# Patient Record
Sex: Female | Born: 2005 | Race: White | Hispanic: Yes | Marital: Single | Smoking: Never smoker
Health system: Southern US, Community
[De-identification: ages and names within clinical notes are randomized; demographics above are authoritative.]

## PROBLEM LIST (undated history)

## (undated) HISTORY — PX: TONSILLECTOMY: SUR1361

---
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
## 2024-01-13 ENCOUNTER — Ambulatory Visit (HOSPITAL_COMMUNITY)
Admission: EM | Admit: 2024-01-13 | Discharge: 2024-01-13 | Disposition: A | Discharge status: Home / Self Care | Attending: Physician Assistant | Admitting: Physician Assistant

## 2024-01-13 ENCOUNTER — Encounter (HOSPITAL_COMMUNITY): Payer: Self-pay | Admitting: Emergency Medicine

## 2024-01-13 DIAGNOSIS — J019 Acute sinusitis, unspecified: Secondary | ICD-10-CM | POA: Diagnosis not present

## 2024-01-13 LAB — POC COVID19/FLU A&B COMBO
Covid Antigen, POC: NEGATIVE
Influenza A Antigen, POC: NEGATIVE
Influenza B Antigen, POC: NEGATIVE

## 2024-01-13 MED ORDER — ACETAMINOPHEN 325 MG PO TABS
ORAL_TABLET | ORAL | Status: AC
  Filled 2024-01-13: qty 2

## 2024-01-13 MED ORDER — ACETAMINOPHEN 325 MG PO TABS
650.0000 mg | ORAL_TABLET | Freq: Once | ORAL | Status: AC
  Administered 2024-01-13: 650 mg via ORAL

## 2024-01-13 MED ORDER — AZITHROMYCIN 250 MG PO TABS: 250.0000 mg | ORAL_TABLET | Freq: Every day | ORAL | 0 refills | Status: AC

## 2024-01-13 NOTE — Discharge Instructions (Signed)
 Take antibiotic as prescribed. Recommend Flonase  and Mucinex for congestion. Make sure you are drinking lots of fluids. Can take Tylenol  or Profen as needed for fever and bodyaches.

## 2024-01-13 NOTE — ED Triage Notes (Signed)
 Pt reports congestion, headaches, sore throat, pressure in ears and jaw for 4 days.

## 2024-01-13 NOTE — ED Provider Notes (Signed)
 [facility]    CSN: [id number] Arrival date & time: 01/13/24  1801      History   Chief Complaint Chief Complaint  Patient presents with   Headache   Nasal Congestion    HPI Ashley Dorsey is a 18 y.o. female.   Patient complains of cough, congestion, headache, sore throat, ear pressure that started about 7 days ago.  She reports initially started with just a scratchy throat and then symptoms progressively became worse.  She reports body aches and chills at home.  Denies sick contacts.  She has only been taking Tylenol  ibuprofen  as needed with minimal relief.  Denies shortness of breath or wheezing.    History reviewed. No pertinent past medical history.  There are no active problems to display for this patient.   Past Surgical History:  Procedure Laterality Date   TONSILLECTOMY      OB History   No obstetric history on file.      Home Medications    Prior to Admission medications   Medication Sig Start Date End Date Taking? Authorizing Provider  azithromycin  (ZITHROMAX  Z-PAK) 250 MG tablet Take 1 tablet (250 mg total) by mouth daily for 5 doses. Take 2 tablets by mouth on the first day and then 1 by mouth on the remaining days. 01/13/24 01/18/24 Yes Ward, Harlene PEDLAR, PA-C  albuterol  (VENTOLIN  HFA) 108 (90 Base) MCG/ACT inhaler Inhale 1-2 puffs into the lungs every 6 (six) hours as needed for wheezing or shortness of breath. 05/02/21   Tonette Lauraine HERO, PA-C  benzonatate  (TESSALON ) 100 MG capsule Take 1 capsule (100 mg total) by mouth every 8 (eight) hours. 05/02/21   Tonette Lauraine HERO, PA-C  chlorhexidine  (PERIDEX ) 0.12 % solution Use as directed 15 mLs in the mouth or throat 2 (two) times daily. Patient not taking: Reported on 05/02/2021 06/05/20   Mario Million, MD  fluticasone  (FLONASE ) 50 MCG/ACT nasal spray Place 2 sprays into both nostrils daily. 05/02/21   Tonette Lauraine HERO, PA-C    Family History Family History  Problem Relation Age of  Onset   Healthy Mother    Healthy Father     Social History Social History   Tobacco Use   Smoking status: Never   Smokeless tobacco: Never  Vaping Use   Vaping status: Never Used  Substance Use Topics   Alcohol use: Never   Drug use: Never     Allergies   Patient has no known allergies.   Review of Systems Review of Systems  Constitutional:  Positive for fever. Negative for chills.  HENT:  Positive for congestion. Negative for ear pain and sore throat.   Eyes:  Negative for pain and visual disturbance.  Respiratory:  Positive for cough. Negative for shortness of breath.   Cardiovascular:  Negative for chest pain and palpitations.  Gastrointestinal:  Negative for abdominal pain and vomiting.  Genitourinary:  Negative for dysuria and hematuria.  Musculoskeletal:  Negative for arthralgias and back pain.  Skin:  Negative for color change and rash.  Neurological:  Positive for headaches. Negative for seizures and syncope.  All other systems reviewed and are negative.    Physical Exam Triage Vital Signs ED Triage Vitals  Encounter Vitals Group     BP 01/13/24 1855 115/74     Girls Systolic BP Percentile --      Girls Diastolic BP Percentile --      Boys Systolic BP Percentile --      Boys Diastolic BP Percentile --  Pulse Rate 01/13/24 1855 (!) 102     Resp 01/13/24 1855 16     Temp 01/13/24 1855 (!) 100.8 F (38.2 C)     Temp Source 01/13/24 1855 Oral     SpO2 01/13/24 1855 97 %     Weight --      Height --      Head Circumference --      Peak Flow --      Pain Score 01/13/24 1854 7     Pain Loc --      Pain Education --      Exclude from Growth Chart --    No data found.  Updated Vital Signs BP 115/74 (BP Location: Right Arm)   Pulse (!) 102   Temp (!) 100.8 F (38.2 C) (Oral)   Resp 16   LMP 12/16/2023 (Approximate)   SpO2 97%   Visual Acuity Right Eye Distance:   Left Eye Distance:   Bilateral Distance:    Right Eye Near:   Left Eye  Near:    Bilateral Near:     Physical Exam Vitals and nursing note reviewed.  Constitutional:      General: She is not in acute distress.    Appearance: She is well-developed.  HENT:     Head: Normocephalic and atraumatic.  Eyes:     Conjunctiva/sclera: Conjunctivae normal.  Cardiovascular:     Rate and Rhythm: Normal rate and regular rhythm.     Heart sounds: No murmur heard. Pulmonary:     Effort: Pulmonary effort is normal. No respiratory distress.     Breath sounds: Normal breath sounds.  Abdominal:     Palpations: Abdomen is soft.     Tenderness: There is no abdominal tenderness.  Musculoskeletal:        General: No swelling.     Cervical back: Neck supple.  Skin:    General: Skin is warm and dry.     Capillary Refill: Capillary refill takes less than 2 seconds.  Neurological:     Mental Status: She is alert.  Psychiatric:        Mood and Affect: Mood normal.      UC Treatments / Results  Labs (all labs ordered are listed, but only abnormal results are displayed) Labs Reviewed  POC COVID19/FLU A&B COMBO    EKG   Radiology No results found.  Procedures Procedures (including critical care time)  Medications Ordered in UC Medications  acetaminophen  (TYLENOL ) tablet 650 mg (650 mg Oral Given 01/13/24 1859)    Initial Impression / Assessment and Plan / UC Course  I have reviewed the triage vital signs and the nursing notes.  Pertinent labs & imaging results that were available during my care of the patient were reviewed by me and considered in my medical decision making (see chart for details).     Will treat for acute sinusitis with  azithromycin .  Supportive care discussed.  Lungs clear to auscultation.  Patient overall well-appearing in no acute distress.  Return precautions discussed.  Flu and COVID negative in clinic today. Final Clinical Impressions(s) / UC Diagnoses   Final diagnoses:  Acute non-recurrent sinusitis, unspecified location      Discharge Instructions      Take antibiotic as prescribed. Recommend Flonase  and Mucinex for congestion. Make sure you are drinking lots of fluids. Can take Tylenol  or Profen as needed for fever and bodyaches.   ED Prescriptions     Medication Sig Dispense Auth. Provider  azithromycin  (ZITHROMAX  Z-PAK) 250 MG tablet Take 1 tablet (250 mg total) by mouth daily for 5 doses. Take 2 tablets by mouth on the first day and then 1 by mouth on the remaining days. 6 tablet Ward, Dania Marsan Z, PA-C      PDMP not reviewed this encounter.   Ward, Harlene PEDLAR, PA-C 01/13/24 2024
